# Patient Record
Sex: Female | Born: 2000
Health system: Southern US, Community
[De-identification: ages and names within clinical notes are randomized; demographics above are authoritative.]

## PROBLEM LIST (undated history)

## (undated) ENCOUNTER — Inpatient Hospital Stay: Payer: Self-pay

## (undated) DIAGNOSIS — F32A Depression, unspecified: Secondary | ICD-10-CM

## (undated) DIAGNOSIS — F329 Major depressive disorder, single episode, unspecified: Secondary | ICD-10-CM

## (undated) HISTORY — PX: TYMPANOSTOMY TUBE PLACEMENT: SHX32

## (undated) HISTORY — DX: Major depressive disorder, single episode, unspecified: F32.9

## (undated) HISTORY — PX: TONSILLECTOMY: SUR1361

## (undated) HISTORY — DX: Depression, unspecified: F32.A

---
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
## 2019-01-03 ENCOUNTER — Other Ambulatory Visit: Payer: Self-pay

## 2019-01-03 DIAGNOSIS — Z20822 Contact with and (suspected) exposure to covid-19: Secondary | ICD-10-CM

## 2019-01-05 LAB — NOVEL CORONAVIRUS, NAA: SARS-CoV-2, NAA: NOT DETECTED

## 2019-04-25 ENCOUNTER — Ambulatory Visit (INDEPENDENT_AMBULATORY_CARE_PROVIDER_SITE_OTHER): Payer: Medicaid Other | Admitting: Obstetrics and Gynecology

## 2019-04-25 ENCOUNTER — Telehealth: Payer: Self-pay | Admitting: Obstetrics and Gynecology

## 2019-04-25 ENCOUNTER — Encounter: Payer: Self-pay | Admitting: Obstetrics and Gynecology

## 2019-04-25 ENCOUNTER — Other Ambulatory Visit: Payer: Self-pay

## 2019-04-25 VITALS — BP 102/58 | Ht 64.0 in | Wt 171.0 lb

## 2019-04-25 DIAGNOSIS — Z3009 Encounter for other general counseling and advice on contraception: Secondary | ICD-10-CM | POA: Diagnosis not present

## 2019-04-25 DIAGNOSIS — N941 Unspecified dyspareunia: Secondary | ICD-10-CM | POA: Diagnosis not present

## 2019-04-25 MED ORDER — LIDOCAINE 5 % EX OINT: 1.0000 "application " | TOPICAL_OINTMENT | CUTANEOUS | 0 refills | Status: DC | PRN

## 2019-04-25 MED ORDER — ETONOGESTREL-ETHINYL ESTRADIOL 0.12-0.015 MG/24HR VA RING: VAGINAL_RING | VAGINAL | 11 refills | Status: DC

## 2019-04-25 NOTE — Progress Notes (Signed)
Patient ID: Jennifer Fuentes, female   DOB: 12/06/2000, 19 y.o.   MRN: [id number]  Reason for Consult: Follow-up (Look at place where stich was pulled, missed period since november, pain with intercourse)   Referred by Dan Humphreys, [facility]  Subjective:     HPI:  Jennifer Fuentes is a 19 y.o. female. She presents today for follow up. She had a complicated second degree perineal laceration with her delivery and urethral trauma secondary to foley insertion with frank hematuria.She delivered on 08/16/2018.  She reports that her hematuria and dysuria resolved. She did not follow up with urology as they advised after her initial consultation.    I saw her several times for a perineal laceration which was slow to heal and was treated with antibiotics.   She reports that since that time she has had pain and vaginal discomfort. She reports that intercourse is extremely painful. She has pain with insertion and along there posterior vaginal wall.  She has pain even to the touch on the outside of her perineum. Some of the area feels numb.   She has been having unprotected intercourse.  She has not been using her Nuva Ring. Her last period was in November.  UPT is negative today in office.   She would like to restart the Nuvaring.   Past Medical History:  Diagnosis Date  . Depression    Family History  Problem Relation Age of Onset  . Breast cancer Maternal Grandmother   . Cancer - Cervical Maternal Grandmother   . Ovarian cancer Mother    Past Surgical History:  Procedure Laterality Date  . TONSILLECTOMY    . TYMPANOSTOMY TUBE PLACEMENT      Short Social History:  Social History   Tobacco Use  . Smoking status: Former Smoker    Quit date: 12/05/2017    Years since quitting: 1.3  . Smokeless tobacco: Never Used  Substance Use Topics  . Alcohol use: No    Allergies  Allergen Reactions  . Sulfa Antibiotics Other (See Comments)    uncertain    Current Outpatient  Medications  Medication Sig Dispense Refill  . docusate sodium (COLACE) 100 MG capsule Take 1 capsule (100 mg total) by mouth 2 (two) times daily. (Patient not taking: Reported on 10/26/2018) 20 capsule 3  . etonogestrel-ethinyl estradiol (NUVARING) 0.12-0.015 MG/24HR vaginal ring Insert vaginally and leave in place for 3 consecutive weeks, then remove for 1 week. (Patient not taking: Reported on 10/11/2018) 1 each 11  . ferrous sulfate 325 (65 FE) MG tablet Take 1 tablet (325 mg total) by mouth 2 (two) times daily with a meal. (Patient not taking: Reported on 10/26/2018) 60 tablet 11  . ibuprofen (ADVIL) 600 MG tablet Take 1 tablet (600 mg total) by mouth every 6 (six) hours. (Patient not taking: Reported on 10/26/2018) 30 tablet 3   No current facility-administered medications for this visit.    Review of Systems  Constitutional: Negative for chills, fatigue, fever and unexpected weight change.  HENT: Negative for trouble swallowing.  Eyes: Negative for loss of vision.  Respiratory: Negative for cough, shortness of breath and wheezing.  Cardiovascular: Negative for chest pain, leg swelling, palpitations and syncope.  GI: Negative for abdominal pain, blood in stool, diarrhea, nausea and vomiting.  GU: Negative for difficulty urinating, dysuria, frequency and hematuria.  Musculoskeletal: Negative for back pain, leg pain and joint pain.  Skin: Negative for rash.  Neurological: Negative for dizziness, headaches, light-headedness, numbness and seizures.  Psychiatric: Negative for behavioral problem, confusion, depressed mood and sleep disturbance.        Objective:  Objective   Vitals:   04/25/19 1614  BP: (!) 102/58  Weight: 171 lb (77.6 kg)  Height: 5\' 4"  (1.626 m)   Body mass index is 29.35 kg/m.  Physical Exam Vitals and nursing note reviewed.  Constitutional:      Appearance: She is well-developed.  HENT:     Head: Normocephalic and atraumatic.  Cardiovascular:     Rate and  Rhythm: Normal rate and regular rhythm.  Pulmonary:     Effort: Pulmonary effort is normal.     Breath sounds: Normal breath sounds.  Abdominal:     General: Bowel sounds are normal.     Palpations: Abdomen is soft.  Genitourinary:    Comments: External: Normal appearing vulva. No lesions noted.  Perineum is well healed. No drainage. Intact. Some sensitivity at posterior introitus along hymenal ring.  Speculum examination: Normal appearing cervix. No blood in the vaginal vault. no discharge.   Split speculum exam, normal posterior wall. Well healed. No abnormalities.  Bimanual examination: Uterus midline, non-tender, normal in size, shape and contour.  No CMT. No adnexal masses. No adnexal tenderness. Pelvis not fixed. Rectal exam: small external hemorrhoid. Normal tone. Somewhat thin recto-vaginal  Tissue and perineal body.  Musculoskeletal:        General: Normal range of motion.  Skin:    General: Skin is warm and dry.  Neurological:     Mental Status: She is alert and oriented to person, place, and time.  Psychiatric:        Behavior: Behavior normal.        Thought Content: Thought content normal.        Judgment: Judgment normal.        Assessment/Plan:     19 yo  1. Dyspareunia and perineal pain secondary toobstetrical laceration. Discussed options for treatment. If she is planning more vaginal deliveries I recommended delaying repair until her next child is born given that there is potential for a recurrence of the laceration with future deliveries. Will treat with topical lidocaine for the time being. If this does not help with the pain then we can consider surgical revision of the perineum.  2. Birth control- She would like to start Fair[place]. Will initiate today and repeat pregnancy test in 12 weeks. Delay unprotected intercourse for 1 week after initiation of the Nuvaring. Rx Sent   More than 30 minutes were spent face to face with the patient in the room with more than  50% of the time spent providing counseling and discussing the plan of management.      Adrian Prows MD [facility], [facility] 04/25/2019 4:30 PM

## 2019-04-25 NOTE — Telephone Encounter (Signed)
Patient requesting refill on nuva ring. [facility]

## 2019-04-26 ENCOUNTER — Other Ambulatory Visit: Payer: Self-pay

## 2019-04-26 DIAGNOSIS — Z3009 Encounter for other general counseling and advice on contraception: Secondary | ICD-10-CM

## 2019-04-26 NOTE — Telephone Encounter (Signed)
Called pt to let her know she still have refills on her Nuvaring, advised pt to call pharmacy when she needs a new rx and if they will not refill it then I will send in a refill for her.

## 2019-05-26 IMAGING — US US OB COMP LESS 14 WK
1 series · 14 of 28 positions shown · non-contrast
Comparison: None.

CLINICAL DATA: Positive pregnancy test, pelvic pain and bleeding

EXAM:
OBSTETRIC <14 WK ULTRASOUND
TECHNIQUE: Transabdominal ultrasound was performed for evaluation of the
gestation as well as the maternal uterus and adnexal regions.

[Series 1: us ob comp less 14 wk · 0.22mm/px · 14 of 30 slices shown]
[im 2/30]
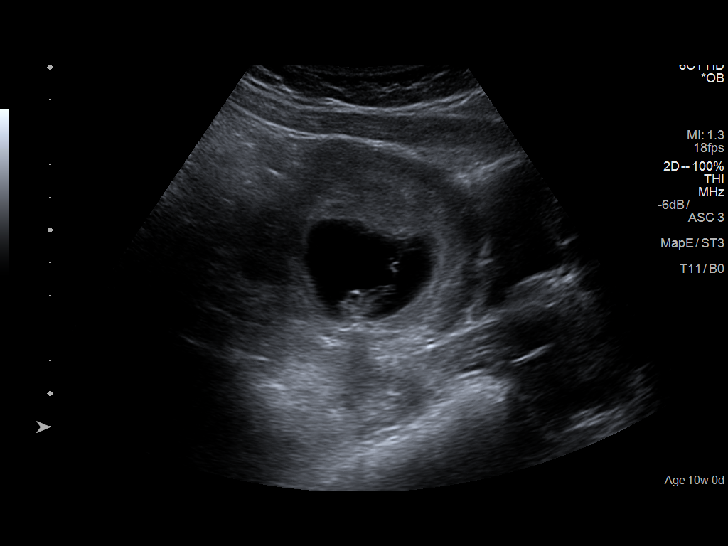
[im 4/30]
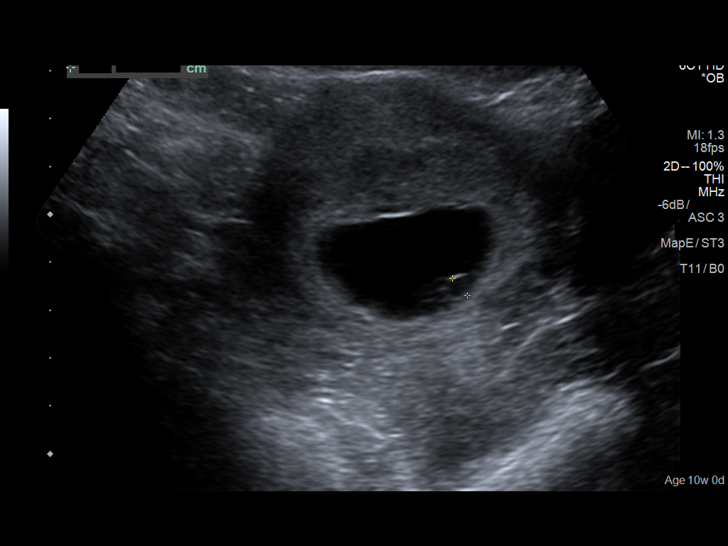
[im 6/30]
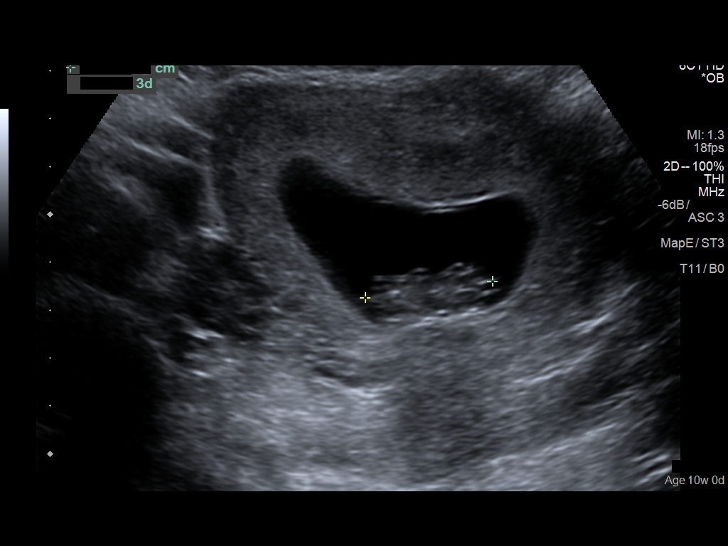
[im 8/30]
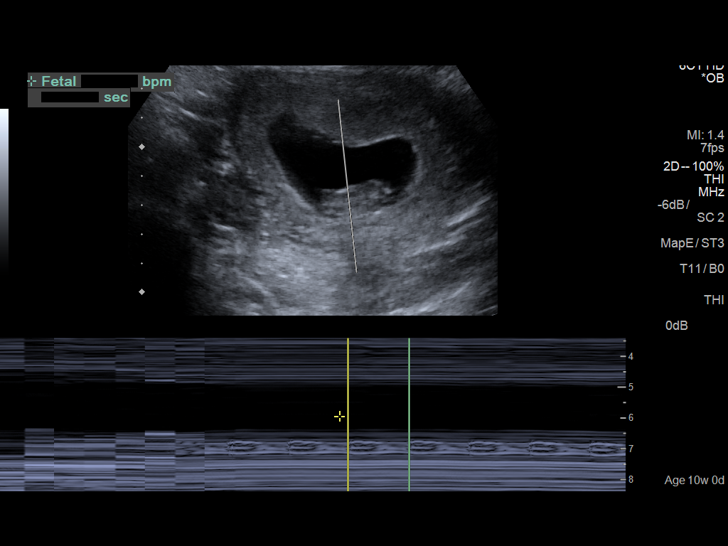
[im 10/30]
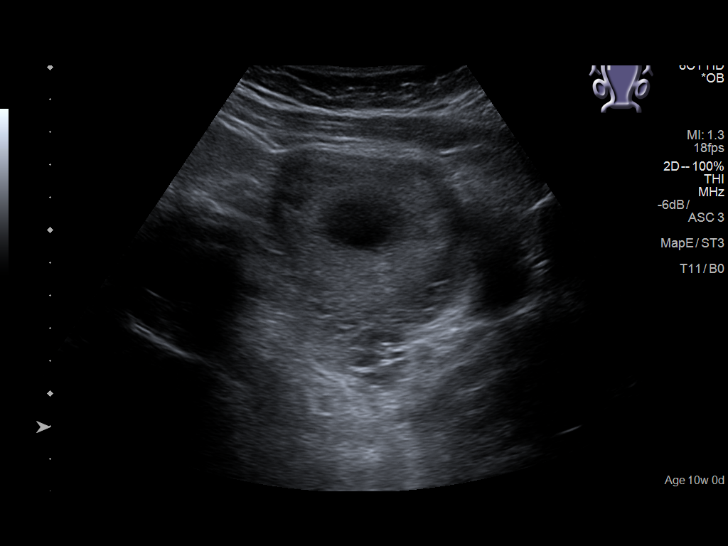
[im 12/30]
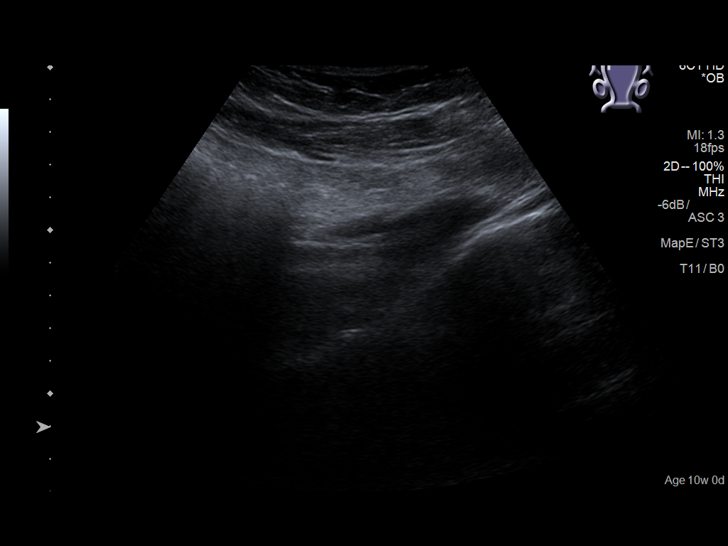
[im 14/30]
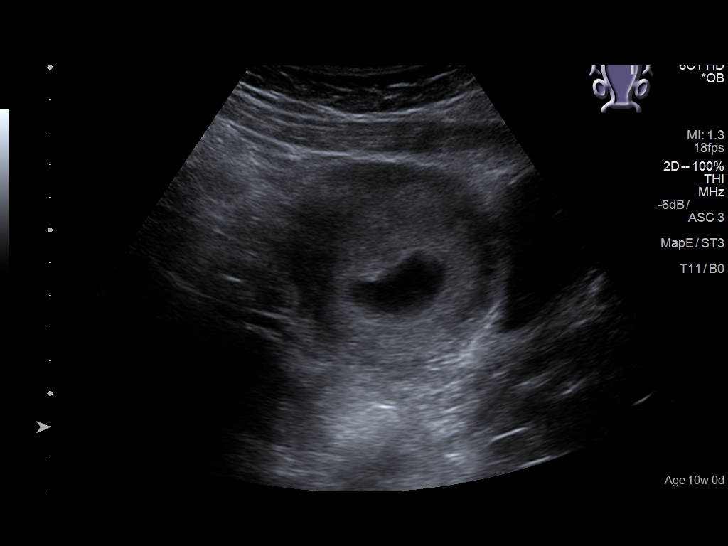
[im 17/30]
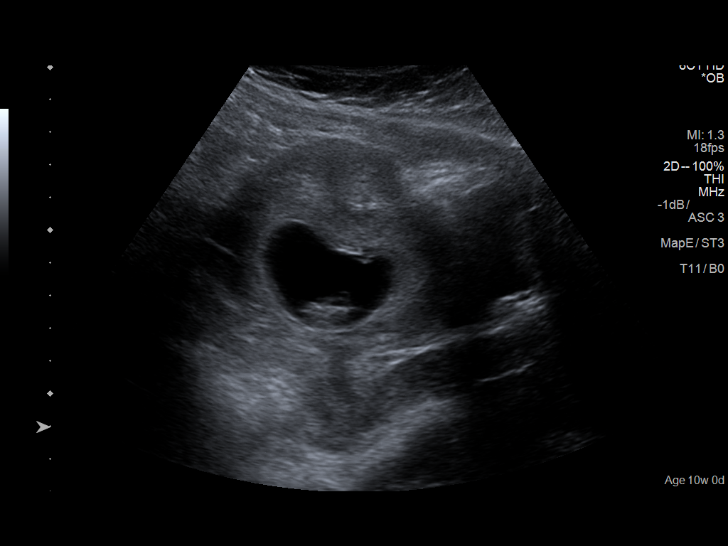
[im 19/30]
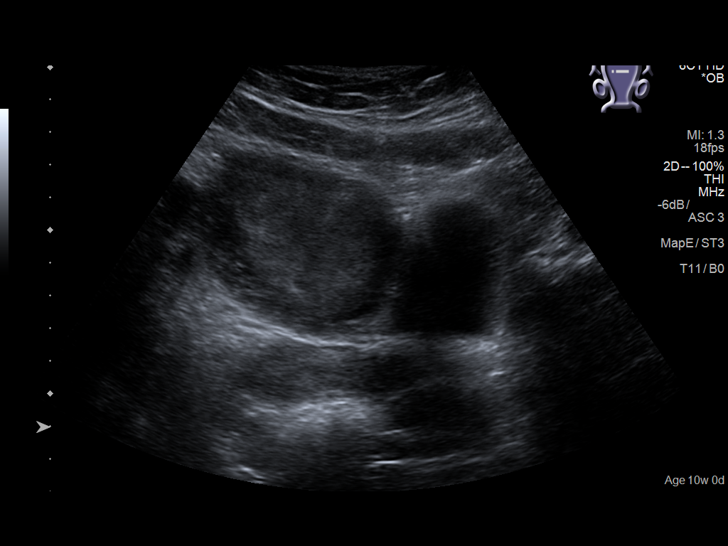
[im 21/30]
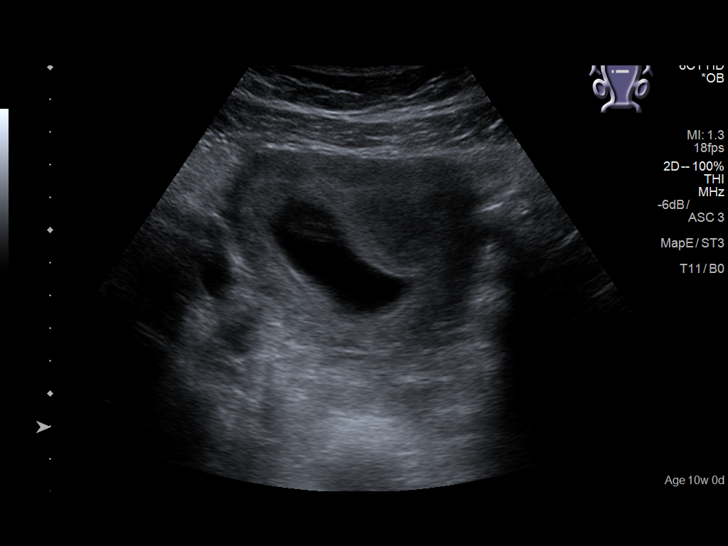
[im 23/30]
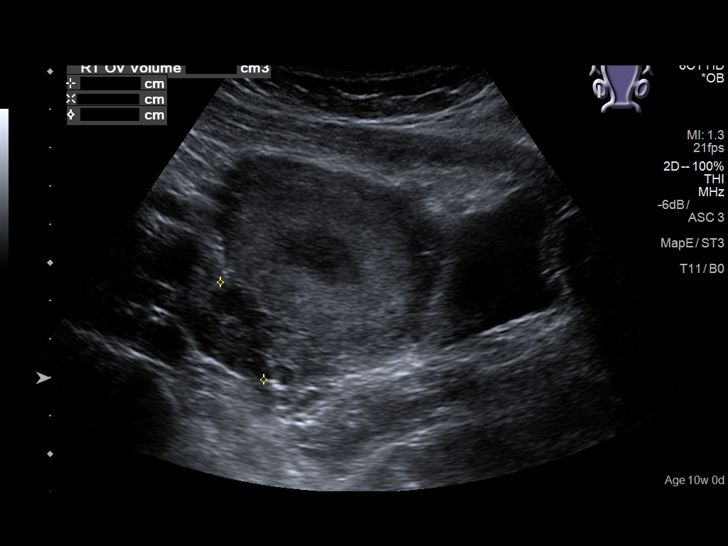
[im 25/30]
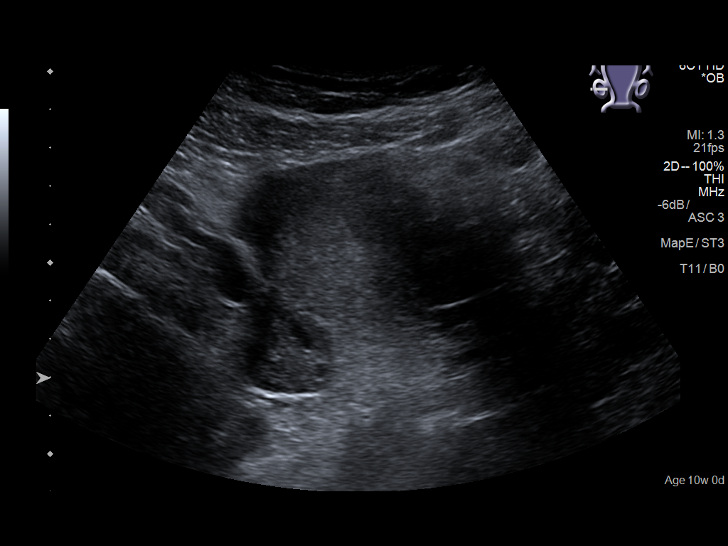
[im 27/30]
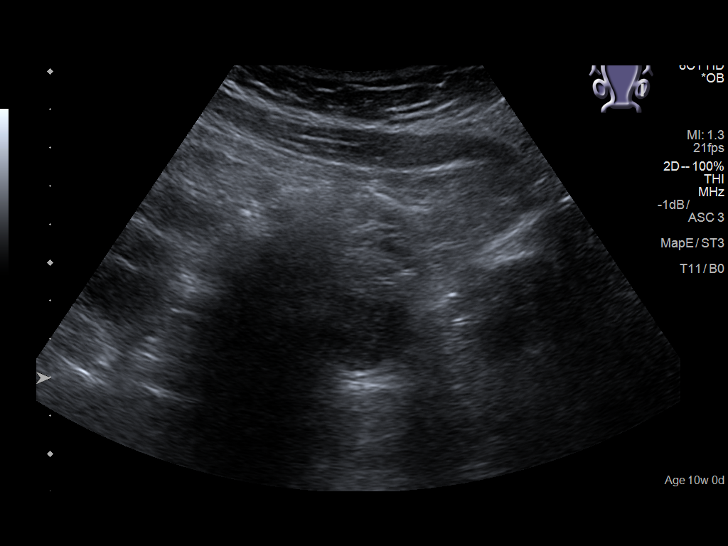
[im 30/30]
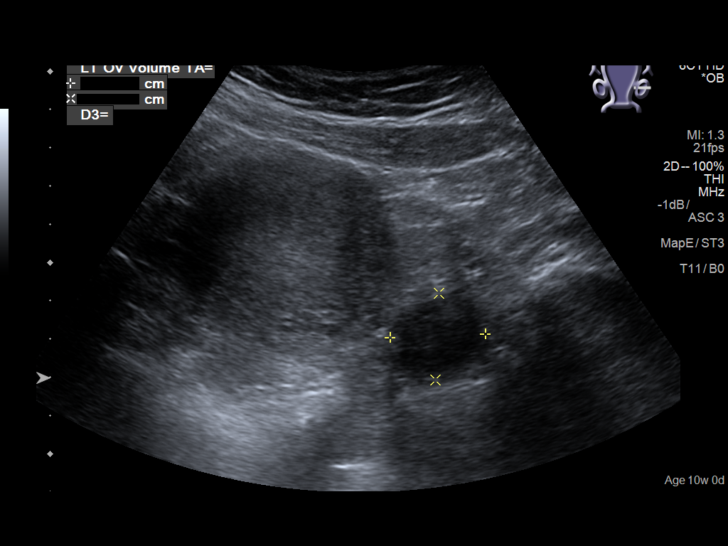

[14 of 28 positions shown; findings below may reference images not displayed]

FINDINGS: Intrauterine gestational sac: Single

Yolk sac:  Visualized.

Embryo:  Visualized.

Cardiac Activity: Visualized.

Heart Rate: 168 bpm

CRL:   26 mm   9 w 2 d                  US EDC: 08/18/2018

Subchorionic hemorrhage:  None visualized.

Maternal uterus/adnexae: Ovaries appear normal.  No free fluid.
IMPRESSION: Single viable 9 week 2 day IUP.

## 2019-06-24 ENCOUNTER — Ambulatory Visit: Payer: Medicaid Other | Admitting: Obstetrics and Gynecology

## 2019-08-25 ENCOUNTER — Ambulatory Visit: Admission: EM | Admit: 2019-08-25 | Payer: Medicaid Other

## 2020-03-14 ENCOUNTER — Ambulatory Visit
Admission: EM | Admit: 2020-03-14 | Discharge: 2020-03-14 | Disposition: A | Discharge status: Home / Self Care | Payer: Medicaid Other | Attending: Physician Assistant | Admitting: Physician Assistant

## 2020-03-14 ENCOUNTER — Other Ambulatory Visit: Payer: Self-pay

## 2020-03-14 DIAGNOSIS — Z111 Encounter for screening for respiratory tuberculosis: Secondary | ICD-10-CM

## 2020-03-14 MED ORDER — TUBERCULIN PPD 5 UNIT/0.1ML ID SOLN
5.0000 [IU] | Freq: Once | INTRADERMAL | Status: DC
  Administered 2020-03-14: 5 [IU] via INTRADERMAL

## 2020-03-14 NOTE — ED Triage Notes (Signed)
Patient in today for PPD placement. Patient denies any positives TB tests in the past. PPD placed left lower forearm. Patient tolerated well. Patient advised to return to clinic to have PPD read in 48-72 hours. Patient voiced agreement and understanding.

## 2020-03-17 ENCOUNTER — Other Ambulatory Visit: Payer: Self-pay

## 2020-03-17 ENCOUNTER — Ambulatory Visit
Admission: EM | Admit: 2020-03-17 | Discharge: 2020-03-17 | Disposition: A | Discharge status: Home / Self Care | Payer: Medicaid Other | Attending: Family Medicine | Admitting: Family Medicine

## 2020-03-17 DIAGNOSIS — Z111 Encounter for screening for respiratory tuberculosis: Secondary | ICD-10-CM | POA: Diagnosis not present

## 2020-03-17 NOTE — ED Triage Notes (Signed)
Patient here for PPD reading.  Patient's results were 19mm Negative in her left forearm on 03/17/20 at 0912.

## 2020-08-17 ENCOUNTER — Ambulatory Visit: Payer: Self-pay | Admitting: *Deleted

## 2020-08-17 NOTE — Telephone Encounter (Signed)
Pt called with complaints of severe lower abdominal pain that worsens when she sits; these pains started the  08/17/20 at 0700; she experienced "lightening crotch" like this when she was pregnant; the pain is rated 10 out of 10, intermittent, and stabbing; recommendations made per nurse triage protocol; she verbalized understanding and will go to the emergency room.   Reason for Disposition . [1] SEVERE pain (e.g., excruciating) AND [2] present > 1 hour  Answer Assessment - Initial Assessment Questions 1. LOCATION: "Where does it hurt?"      Lower,middle 2. RADIATION: "Does the pain shoot anywhere else?" (e.g., chest, back)    no 3. ONSET: "When did the pain begin?" (e.g., minutes, hours or days ago)     0700 08/17/20 4. SUDDEN: "Gradual or sudden onset?"     suddenly 5. PATTERN "Does the pain come and go, or is it constant?"    - If constant: "Is it getting better, staying the same, or worsening?"      (Note: Constant means the pain never goes away completely; most serious pain is constant and it progresses)     - If intermittent: "How long does it last?" "Do you have pain now?"     (Note: Intermittent means the pain goes away completely between bouts)    intermittent 6. SEVERITY: "How bad is the pain?"  (e.g., Scale 1-10; mild, moderate, or severe)   - MILD (1-3): doesn't interfere with normal activities, abdomen soft and not tender to touch    - MODERATE (4-7): interferes with normal activities or awakens from sleep, abdomen tender to touch    - SEVERE (8-10): excruciating pain, doubled over, unable to do any normal activities      10 out of 10 7. RECURRENT SYMPTOM: "Have you ever had this type of stomach pain before?" If Yes, ask: "When was the last time?" and "What happened that time?"      Yes when pregnant 8. CAUSE: "What do you think is causing the stomach pain?"     Not sure 9. RELIEVING/AGGRAVATING FACTORS: "What makes it better or worse?" (e.g., movement, antacids, bowel  movement)     no 10. OTHER SYMPTOMS: "Do you have any other symptoms?" (e.g., back pain, diarrhea, fever, urination pain, vomiting)       no 11. PREGNANCY: "Is there any chance you are pregnant?" "When was your last menstrual period?"       No, LMP 07/30/20  Protocols used: ABDOMINAL PAIN - [facility]

## 2020-08-31 ENCOUNTER — Telehealth: Payer: Self-pay

## 2020-08-31 NOTE — Telephone Encounter (Signed)
Called pt and asked that she come in for chlamydia check. She has a GYN doctor, therefore I advised her to get it done through them if she didn't want to come here. She will let me know what she is going to do next week

## 2020-12-12 ENCOUNTER — Encounter: Payer: Self-pay | Admitting: Hematology and Oncology

## 2020-12-13 ENCOUNTER — Ambulatory Visit
Admission: EM | Admit: 2020-12-13 | Discharge: 2020-12-13 | Disposition: A | Discharge status: Home / Self Care | Payer: Medicaid Other | Attending: Family Medicine | Admitting: Family Medicine

## 2020-12-13 ENCOUNTER — Other Ambulatory Visit: Payer: Self-pay

## 2020-12-13 DIAGNOSIS — R040 Epistaxis: Secondary | ICD-10-CM | POA: Diagnosis not present

## 2020-12-13 DIAGNOSIS — H6592 Unspecified nonsuppurative otitis media, left ear: Secondary | ICD-10-CM | POA: Diagnosis not present

## 2020-12-13 DIAGNOSIS — R059 Cough, unspecified: Secondary | ICD-10-CM | POA: Diagnosis not present

## 2020-12-13 MED ORDER — AMOXICILLIN-POT CLAVULANATE 875-125 MG PO TABS: 1.0000 | ORAL_TABLET | Freq: Two times a day (BID) | ORAL | 0 refills | Status: DC

## 2020-12-13 MED ORDER — PROMETHAZINE-DM 6.25-15 MG/5ML PO SYRP: 5.0000 mL | ORAL_SOLUTION | Freq: Four times a day (QID) | ORAL | 0 refills | Status: DC | PRN

## 2020-12-13 NOTE — ED Provider Notes (Signed)
[facility]    CSN: [id number] Arrival date & time: 12/13/20  1839      History   Chief Complaint Chief Complaint  Patient presents with   Ear Pain   Cough   HPI  20 year old female presents with the above complaints..  Patient has had symptoms since Saturday.  She reports frequent nosebleeds, and cough.  Cough is worse at night.  Also reports left ear pain.  Pain is currently 7/10 in severity.  No documented fever.  Her daughter has recently been sick with similar symptoms.  She states that her nosebleed earlier today took several minutes to resolve.  No other associated symptoms.  No other complaints.  Past Medical History:  Diagnosis Date   Depression    Patient Active Problem List   Diagnosis Date Noted   [redacted] weeks gestation of pregnancy 08/14/2018   Iron deficiency anemia 07/20/2018   Indication for care in labor and delivery, antepartum 07/10/2018   Low back pain 06/22/2018   Tobacco use in pregnancy, antepartum, first trimester 12/18/2017   RLQ abdominal pain 06/04/2017   History of ovarian cyst 06/04/2017   Urinary tract infection without hematuria 06/04/2017   Family history of ovarian cancer 04/08/2017    Past Surgical History:  Procedure Laterality Date   TONSILLECTOMY     TYMPANOSTOMY TUBE PLACEMENT      OB History     Gravida  1   Para  1   Term  1   Preterm  0   AB  0   Living  1      SAB  0   IAB  0   Ectopic  0   Multiple  0   Live Births  1            Home Medications    Prior to Admission medications   Medication Sig Start Date End Date Taking? Authorizing Provider  amoxicillin-clavulanate (AUGMENTIN) 875-125 MG tablet Take 1 tablet by mouth 2 (two) times daily. 12/13/20  Yes Suesan Mohrmann G, DO  promethazine-dextromethorphan (PROMETHAZINE-DM) 6.25-15 MG/5ML syrup Take 5 mLs by mouth 4 (four) times daily as needed for cough. 12/13/20  Yes Tommie Sams, DO    Family History Family History  Problem Relation  Age of Onset   Breast cancer Maternal Grandmother    Cancer - Cervical Maternal Grandmother    Ovarian cancer Mother     Social History Social History   Tobacco Use   Smoking status: Former    Types: Cigarettes    Quit date: 12/05/2017    Years since quitting: 3.0   Smokeless tobacco: Never  Vaping Use   Vaping Use: Former  Substance Use Topics   Alcohol use: No   Drug use: Not Currently    Types: Marijuana     Allergies   Sulfa antibiotics   Review of Systems Review of Systems Per HPI  Physical Exam Triage Vital Signs ED Triage Vitals  Enc Vitals Group     BP 12/13/20 1919 118/74     Pulse Rate 12/13/20 1919 85     Resp 12/13/20 1919 17     Temp 12/13/20 1919 99 F (37.2 C)     Temp Source 12/13/20 1919 Oral     SpO2 12/13/20 1919 98 %     Weight 12/13/20 1917 150 lb (68 kg)     Height 12/13/20 1917 5\' 4"  (1.626 m)     Head Circumference --      Peak  Flow --      Pain Score 12/13/20 1917 7     Pain Loc --      Pain Edu? --      Excl. in GC? --    No data found.  Updated Vital Signs BP 118/74 (BP Location: Left Arm)   Pulse 85   Temp 99 F (37.2 C) (Oral)   Resp 17   Ht 5\' 4"  (1.626 m)   Wt 68 kg   LMP 12/11/2020   SpO2 98%   BMI 25.75 kg/m   Visual Acuity Right Eye Distance:   Left Eye Distance:   Bilateral Distance:    Right Eye Near:   Left Eye Near:    Bilateral Near:     Physical Exam Vitals and nursing note reviewed.  Constitutional:      General: She is not in acute distress.    Appearance: Normal appearance. She is not ill-appearing.  HENT:     Head: Normocephalic and atraumatic.     Left Ear: Tympanic membrane normal.     Ears:     Comments: Left ear effusion.    Nose: Congestion present.     Mouth/Throat:     Comments: Cobblestoning noted. Cardiovascular:     Rate and Rhythm: Normal rate and regular rhythm.  Pulmonary:     Effort: Pulmonary effort is normal.     Breath sounds: Normal breath sounds. No wheezing,  rhonchi or rales.  Neurological:     Mental Status: She is alert.     UC Treatments / Results  Labs (all labs ordered are listed, but only abnormal results are displayed) Labs Reviewed - No data to display  EKG   Radiology No results found.  Procedures Procedures (including critical care time)  Medications Ordered in UC Medications - No data to display  Initial Impression / Assessment and Plan / UC Course  I have reviewed the triage vital signs and the nursing notes.  Pertinent labs & imaging results that were available during my care of the patient were reviewed by me and considered in my medical decision making (see chart for details).    20 year old female presents with the above complaints.  Effusion noted on exam.  Treating with Augmentin.  Promethazine DM for cough.  Regarding her frequent nosebleeds, advised nasal saline.  Supportive care.  Final Clinical Impressions(s) / UC Diagnoses   Final diagnoses:  Fluid level behind tympanic membrane of left ear  Cough  Frequent nosebleeds     Discharge Instructions      Medication as directed.  Nasal saline twice daily.   Take care  Dr. 36   ED Prescriptions     Medication Sig Dispense Auth. Provider   promethazine-dextromethorphan (PROMETHAZINE-DM) 6.25-15 MG/5ML syrup Take 5 mLs by mouth 4 (four) times daily as needed for cough. 118 mL Alishea Beaudin G, DO   amoxicillin-clavulanate (AUGMENTIN) 875-125 MG tablet Take 1 tablet by mouth 2 (two) times daily. 20 tablet 06-[birth year]-1983, DO      PDMP not reviewed this encounter.   Tommie Sams, Tommie Sams 12/13/20 2036

## 2020-12-13 NOTE — Discharge Instructions (Addendum)
Medication as directed.  Nasal saline twice daily.   Take care  Dr. Adriana Simas

## 2020-12-13 NOTE — ED Triage Notes (Signed)
Patient states that she has been having left ear pain, cough and 2 nosebleeds. States that her daughter was sick earlier in the week and she had similar symptoms.

## 2021-03-29 ENCOUNTER — Encounter: Payer: Self-pay | Admitting: Hematology and Oncology

## 2021-04-23 ENCOUNTER — Ambulatory Visit
Admission: EM | Admit: 2021-04-23 | Discharge: 2021-04-23 | Disposition: A | Discharge status: Home / Self Care | Payer: Medicaid Other | Attending: Emergency Medicine | Admitting: Emergency Medicine

## 2021-04-23 ENCOUNTER — Encounter: Payer: Self-pay | Admitting: Emergency Medicine

## 2021-04-23 ENCOUNTER — Other Ambulatory Visit: Payer: Self-pay

## 2021-04-23 DIAGNOSIS — J0100 Acute maxillary sinusitis, unspecified: Secondary | ICD-10-CM

## 2021-04-23 MED ORDER — AMOXICILLIN 875 MG PO TABS
875.0000 mg | ORAL_TABLET | Freq: Two times a day (BID) | ORAL | 0 refills | Status: AC
Start: 2021-04-23 — End: 2021-05-03

## 2021-04-23 NOTE — Discharge Instructions (Addendum)
Take the amoxicillin as directed.  Take plain Mucinex and ibuprofen also.  Follow up with your primary care provider if your symptoms are not improving.

## 2021-04-23 NOTE — ED Provider Notes (Signed)
[facility]    CSN: [id number] Arrival date & time: 04/23/21  1755      History   Chief Complaint Chief Complaint  Patient presents with   Cough   Otalgia   Nasal Congestion   Headache    HPI Jennifer Fuentes is a 21 y.o. female.  Patient presents with 2 week history of sinus congestion, runny nose, ear pain, cough. She has sore throat and headache when coughing.  No fever, rash, shortness of breath, vomiting, diarrhea, or other symptoms.  Treatment at home with OTC sinus medication.    The history is provided by the patient and medical records.   Past Medical History:  Diagnosis Date   Depression     Patient Active Problem List   Diagnosis Date Noted   [redacted] weeks gestation of pregnancy 08/14/2018   Iron deficiency anemia 07/20/2018   Indication for care in labor and delivery, antepartum 07/10/2018   Low back pain 06/22/2018   Tobacco use in pregnancy, antepartum, first trimester 12/18/2017   RLQ abdominal pain 06/04/2017   History of ovarian cyst 06/04/2017   Urinary tract infection without hematuria 06/04/2017   Family history of ovarian cancer 04/08/2017    Past Surgical History:  Procedure Laterality Date   TONSILLECTOMY     TYMPANOSTOMY TUBE PLACEMENT      OB History     Gravida  1   Para  1   Term  1   Preterm  0   AB  0   Living  1      SAB  0   IAB  0   Ectopic  0   Multiple  0   Live Births  1            Home Medications    Prior to Admission medications   Medication Sig Start Date End Date Taking? Authorizing Provider  amoxicillin (AMOXIL) 875 MG tablet Take 1 tablet (875 mg total) by mouth 2 (two) times daily for 10 days. 04/23/21 05/03/21 Yes Sharion Balloon, NP  promethazine-dextromethorphan (PROMETHAZINE-DM) 6.25-15 MG/5ML syrup Take 5 mLs by mouth 4 (four) times daily as needed for cough. 12/13/20   Coral Spikes, DO    Family History Family History  Problem Relation Age of Onset   Breast cancer Maternal  Grandmother    Cancer - Cervical Maternal Grandmother    Ovarian cancer Mother     Social History Social History   Tobacco Use   Smoking status: Former    Types: Cigarettes    Quit date: 12/05/2017    Years since quitting: 3.3   Smokeless tobacco: Never  Vaping Use   Vaping Use: Former  Substance Use Topics   Alcohol use: No   Drug use: Not Currently    Types: Marijuana     Allergies   Sulfa antibiotics   Review of Systems Review of Systems  Constitutional:  Negative for chills and fever.  HENT:  Positive for congestion, ear pain, postnasal drip, rhinorrhea, sinus pressure and sore throat.   Respiratory:  Positive for cough. Negative for shortness of breath.   Gastrointestinal:  Negative for diarrhea and vomiting.  Skin:  Negative for color change and rash.  Neurological:  Positive for headaches.  All other systems reviewed and are negative.   Physical Exam Triage Vital Signs ED Triage Vitals  Enc Vitals Group     BP      Pulse      Resp  Temp      Temp src      SpO2      Weight      Height      Head Circumference      Peak Flow      Pain Score      Pain Loc      Pain Edu?      Excl. in GC?    No data found.  Updated Vital Signs BP 121/77 (BP Location: Left Arm)    Pulse 88    Temp 98.2 F (36.8 C)    Resp 18    SpO2 97%   Visual Acuity Right Eye Distance:   Left Eye Distance:   Bilateral Distance:    Right Eye Near:   Left Eye Near:    Bilateral Near:     Physical Exam Vitals and nursing note reviewed.  Constitutional:      General: She is not in acute distress.    Appearance: Normal appearance. She is well-developed.  HENT:     Right Ear: Tympanic membrane normal.     Left Ear: Tympanic membrane normal.     Nose: Congestion and rhinorrhea present.     Mouth/Throat:     Mouth: Mucous membranes are moist.     Pharynx: Oropharynx is clear.  Cardiovascular:     Rate and Rhythm: Normal rate and regular rhythm.     Heart sounds:  Normal heart sounds.  Pulmonary:     Effort: Pulmonary effort is normal. No respiratory distress.     Breath sounds: Normal breath sounds.  Abdominal:     Palpations: Abdomen is soft.  Musculoskeletal:     Cervical back: Neck supple.  Skin:    General: Skin is warm and dry.  Neurological:     Mental Status: She is alert.  Psychiatric:        Mood and Affect: Mood normal.        Behavior: Behavior normal.     UC Treatments / Results  Labs (all labs ordered are listed, but only abnormal results are displayed) Labs Reviewed - No data to display  EKG   Radiology No results found.  Procedures Procedures (including critical care time)  Medications Ordered in UC Medications - No data to display  Initial Impression / Assessment and Plan / UC Course  I have reviewed the triage vital signs and the nursing notes.  Pertinent labs & imaging results that were available during my care of the patient were reviewed by me and considered in my medical decision making (see chart for details).    Acute sinusitis.  Patient has been symptomatic for 2 weeks and is not improving with OTC treatment.  Treating today with amoxicillin.  Discussed symptomatic treatment with plain Mucinex and ibuprofen.  Education provided on sinusitis.  Instructed patient to follow up with her PCP if her symptoms are not improving.  She agrees to plan of care.   Final Clinical Impressions(s) / UC Diagnoses   Final diagnoses:  Acute non-recurrent maxillary sinusitis     Discharge Instructions      Take the amoxicillin as directed.  Take plain Mucinex and ibuprofen also.  Follow up with your primary care provider if your symptoms are not improving.        ED Prescriptions     Medication Sig Dispense Auth. Provider   amoxicillin (AMOXIL) 875 MG tablet Take 1 tablet (875 mg total) by mouth 2 (two) times daily for 10 days. 20  tablet Sharion Balloon, NP      PDMP not reviewed this encounter.   Sharion Balloon, NP 04/23/21 1845

## 2021-04-23 NOTE — ED Triage Notes (Signed)
Pt here with congestion, sore throat, cough, headache, ear pain x 2 weeks. No fevers.

## 2022-02-17 ENCOUNTER — Encounter: Payer: Self-pay | Admitting: Hematology and Oncology

## 2022-02-21 ENCOUNTER — Ambulatory Visit
Admission: RE | Admit: 2022-02-21 | Discharge: 2022-02-21 | Disposition: A | Discharge status: Home / Self Care | Payer: Medicaid Other | Source: Ambulatory Visit | Attending: Emergency Medicine | Admitting: Emergency Medicine

## 2022-02-21 VITALS — BP 109/74 | HR 83 | Resp 18 | Ht 64.0 in | Wt 155.0 lb

## 2022-02-21 DIAGNOSIS — B3731 Acute candidiasis of vulva and vagina: Secondary | ICD-10-CM | POA: Diagnosis not present

## 2022-02-21 DIAGNOSIS — N760 Acute vaginitis: Secondary | ICD-10-CM

## 2022-02-21 DIAGNOSIS — B9689 Other specified bacterial agents as the cause of diseases classified elsewhere: Secondary | ICD-10-CM | POA: Diagnosis not present

## 2022-02-21 LAB — URINALYSIS, ROUTINE W REFLEX MICROSCOPIC
Bilirubin Urine: NEGATIVE
Glucose, UA: NEGATIVE mg/dL
Hgb urine dipstick: NEGATIVE
Ketones, ur: NEGATIVE mg/dL
Nitrite: NEGATIVE
Specific Gravity, Urine: 1.015 (ref 1.005–1.030)
pH: 8.5 — ABNORMAL HIGH (ref 5.0–8.0)

## 2022-02-21 LAB — URINALYSIS, MICROSCOPIC (REFLEX)

## 2022-02-21 LAB — WET PREP, GENITAL
Sperm: NONE SEEN
Trich, Wet Prep: NONE SEEN
WBC, Wet Prep HPF POC: >10 — AB (ref <10)

## 2022-02-21 LAB — PREGNANCY, URINE: Preg Test, Ur: NEGATIVE

## 2022-02-21 MED ORDER — FLUCONAZOLE 150 MG PO TABS: 150.0000 mg | ORAL_TABLET | Freq: Every day | ORAL | 0 refills | Status: AC

## 2022-02-21 MED ORDER — METRONIDAZOLE 500 MG PO TABS: 500.0000 mg | ORAL_TABLET | Freq: Two times a day (BID) | ORAL | 0 refills | Status: DC

## 2022-02-21 NOTE — ED Triage Notes (Signed)
Pt c/o nausea x5days, abdominal pain in the morning, swelling along the abdomen, pain along the upper groin and left flank, x1day  Pt states that she is also having vaginal itching over the weekend but it has gone away.

## 2022-02-21 NOTE — Discharge Instructions (Addendum)
Today you are being treated  for  Bacterial vaginosis and yeast  Wet Prep is negative for trichomoniasis  Urinalysis is negative  Urine pregnancy is negative  Take Metronidazole 500 mg twice a day for 7 days, do not drink alcohol while using medication, this will make you feel sick   Take 1 Diflucan tablet on the day that you receive your medicine then after completion of metronidazole take second tablet  Bacterial vaginosis and yeast occur due to changes in the vaginal ph which results from an overgrowth of one on several organisms that are normally present in your vagina. Vaginosis is an inflammation of the vagina that can result in discharge, itching and pain.    In addition: Avoid baths, hot tubs and whirlpool spas.  Don't use scented or harsh soaps Avoid irritants. These include scented tampons and pads. Wipe from front to back after using the toilet. Don't douche. Your vagina doesn't require cleansing other than normal bathing.  Use a condom.  Wear cotton underwear, this fabric absorbs some moisture.

## 2022-02-21 NOTE — ED Provider Notes (Signed)
[facility]    CSN: [id number] Arrival date & time: 02/21/22  1813      History   Chief Complaint Chief Complaint  Patient presents with   Abdominal Pain    HPI Jennifer Fuentes is a 21 y.o. female.   Patient presents with nausea without vomiting for 5 days, began to have constant lower abdominal pain and left lower back pain beginning overnight.  Pain is described as an aching sharp pain after eating and throbbing pain after movement.  He experienced vaginal itching over the weekend but this has resolved.  Has been experiencing dysuria intermittently as well.  Sexually active, 1 female partner, no known exposure.  Last menstrual period 01/26/2020.  Has attempted use of over-the-counter Azo without relief. Denies Urinary frequency, urgency, hematuria, vaginal discharge, vaginal odor.     Past Medical History:  Diagnosis Date   Depression     Patient Active Problem List   Diagnosis Date Noted   [redacted] weeks gestation of pregnancy 08/14/2018   Iron deficiency anemia 07/20/2018   Indication for care in labor and delivery, antepartum 07/10/2018   Low back pain 06/22/2018   Tobacco use in pregnancy, antepartum, first trimester 12/18/2017   RLQ abdominal pain 06/04/2017   History of ovarian cyst 06/04/2017   Urinary tract infection without hematuria 06/04/2017   Family history of ovarian cancer 04/08/2017    Past Surgical History:  Procedure Laterality Date   TONSILLECTOMY     TYMPANOSTOMY TUBE PLACEMENT      OB History     Gravida  1   Para  1   Term  1   Preterm  0   AB  0   Living  1      SAB  0   IAB  0   Ectopic  0   Multiple  0   Live Births  1            Home Medications    Prior to Admission medications   Medication Sig Start Date End Date Taking? Authorizing Provider  promethazine-dextromethorphan (PROMETHAZINE-DM) 6.25-15 MG/5ML syrup Take 5 mLs by mouth 4 (four) times daily as needed for cough. 12/13/20   Tommie Sams,  DO    Family History Family History  Problem Relation Age of Onset   Breast cancer Maternal Grandmother    Cancer - Cervical Maternal Grandmother    Ovarian cancer Mother     Social History Social History   Tobacco Use   Smoking status: Former    Types: Cigarettes    Quit date: 12/05/2017    Years since quitting: 4.2   Smokeless tobacco: Never  Vaping Use   Vaping Use: Former  Substance Use Topics   Alcohol use: No   Drug use: Not Currently    Types: Marijuana     Allergies   Sulfa antibiotics   Review of Systems Review of Systems  Gastrointestinal:  Positive for abdominal pain and diarrhea. Negative for abdominal distention, anal bleeding, blood in stool, constipation, nausea, rectal pain and vomiting.  Genitourinary:  Positive for dysuria. Negative for decreased urine volume, difficulty urinating, dyspareunia, enuresis, flank pain, frequency, genital sores, hematuria, menstrual problem, pelvic pain, urgency, vaginal bleeding, vaginal discharge and vaginal pain.  Skin: Negative.      Physical Exam Triage Vital Signs ED Triage Vitals  Enc Vitals Group     BP 02/21/22 1829 109/74     Pulse Rate 02/21/22 1829 83     Resp 02/21/22  1829 18     Temp --      Temp Source 02/21/22 1829 Oral     SpO2 02/21/22 1829 98 %     Weight 02/21/22 1826 155 lb (70.3 kg)     Height 02/21/22 1826 5\' 4"  (1.626 m)     Head Circumference --      Peak Flow --      Pain Score 02/21/22 1826 4     Pain Loc --      Pain Edu? --      Excl. in GC? --    No data found.  Updated Vital Signs BP 109/74 (BP Location: Left Arm)   Pulse 83   Resp 18   Ht 5\' 4"  (1.626 m)   Wt 155 lb (70.3 kg)   LMP 01/13/2022   SpO2 98%   BMI 26.61 kg/m   Visual Acuity Right Eye Distance:   Left Eye Distance:   Bilateral Distance:    Right Eye Near:   Left Eye Near:    Bilateral Near:     Physical Exam Constitutional:      Appearance: Normal appearance. She is well-developed.  Eyes:      Extraocular Movements: Extraocular movements intact.  Pulmonary:     Effort: Pulmonary effort is normal.  Abdominal:     Tenderness: There is abdominal tenderness in the suprapubic area, left upper quadrant and left lower quadrant. There is no right CVA tenderness or left CVA tenderness.  Genitourinary:    Comments: deferred Skin:    General: Skin is warm and dry.  Neurological:     Mental Status: She is alert and oriented to person, place, and time. Mental status is at baseline.  Psychiatric:        Behavior: Behavior normal.      UC Treatments / Results  Labs (all labs ordered are listed, but only abnormal results are displayed) Labs Reviewed  WET PREP, GENITAL  URINALYSIS, ROUTINE W REFLEX MICROSCOPIC  PREGNANCY, URINE    EKG   Radiology No results found.  Procedures Procedures (including critical care time)  Medications Ordered in UC Medications - No data to display  Initial Impression / Assessment and Plan / UC Course  I have reviewed the triage vital signs and the nursing notes.  Pertinent labs & imaging results that were available during my care of the patient were reviewed by me and considered in my medical decision making (see chart for details).  Bacterial vaginosis, yeast vaginitis  Tenderness is noted over the left upper and lower quadrants of the abdomen, low suspicion for an acute abdomen at this time, wet prep positive for yeast and BV but negative for treatment, urinalysis and urine pregnancy negative, discussed with patient, metronidazole and Diflucan prescribed, discussed administration, advised abstinence until symptoms have cleared and treatment is completed, may follow-up with urgent care as needed if symptoms persist or worsen Final Clinical Impressions(s) / UC Diagnoses   Final diagnoses:  None   Discharge Instructions   None    ED Prescriptions   None    PDMP not reviewed this encounter.   , NP 02/21/22 1859

## 2022-03-19 ENCOUNTER — Ambulatory Visit
Admission: EM | Admit: 2022-03-19 | Discharge: 2022-03-19 | Disposition: A | Discharge status: Home / Self Care | Payer: Medicaid Other | Attending: Emergency Medicine | Admitting: Emergency Medicine

## 2022-03-19 DIAGNOSIS — J069 Acute upper respiratory infection, unspecified: Secondary | ICD-10-CM | POA: Insufficient documentation

## 2022-03-19 LAB — RAPID INFLUENZA A&B ANTIGENS
Influenza A (ARMC): NEGATIVE
Influenza B (ARMC): NEGATIVE

## 2022-03-19 LAB — GROUP A STREP BY PCR: Group A Strep by PCR: NOT DETECTED

## 2022-03-19 NOTE — ED Provider Notes (Signed)
[facility]    CSN: [id number] Arrival date & time: 03/19/22  1803      History   Chief Complaint Chief Complaint  Patient presents with   URI    HPI Jennifer Fuentes is a 21 y.o. female.   Patient presents for fever, chills, body aches, nasal congestion, sore throat and nonproductive cough beginning 1 day ago.  Known sick contact.  Decreased appetite but tolerating fluids.  Has attempted use of DayQuil which has been minimally helpful.  No pertinent medical history.  Denies shortness of breath or wheezing.  For 1 day, known sick contact, dayquil, decreased appeitie,   Past Medical History:  Diagnosis Date   Depression     Patient Active Problem List   Diagnosis Date Noted   [redacted] weeks gestation of pregnancy 08/14/2018   Iron deficiency anemia 07/20/2018   Indication for care in labor and delivery, antepartum 07/10/2018   Low back pain 06/22/2018   Tobacco use in pregnancy, antepartum, first trimester 12/18/2017   RLQ abdominal pain 06/04/2017   History of ovarian cyst 06/04/2017   Urinary tract infection without hematuria 06/04/2017   Family history of ovarian cancer 04/08/2017    Past Surgical History:  Procedure Laterality Date   TONSILLECTOMY     TYMPANOSTOMY TUBE PLACEMENT      OB History     Gravida  1   Para  1   Term  1   Preterm  0   AB  0   Living  1      SAB  0   IAB  0   Ectopic  0   Multiple  0   Live Births  1            Home Medications    Prior to Admission medications   Medication Sig Start Date End Date Taking? Authorizing Provider  metroNIDAZOLE (FLAGYL) 500 MG tablet Take 1 tablet (500 mg total) by mouth 2 (two) times daily. 02/21/22   Peytin Dechert, Elita Boone, NP  promethazine-dextromethorphan (PROMETHAZINE-DM) 6.25-15 MG/5ML syrup Take 5 mLs by mouth 4 (four) times daily as needed for cough. 12/13/20   Tommie Sams, DO    Family History Family History  Problem Relation Age of Onset   Breast cancer  Maternal Grandmother    Cancer - Cervical Maternal Grandmother    Ovarian cancer Mother     Social History Social History   Tobacco Use   Smoking status: Former    Types: Cigarettes    Quit date: 12/05/2017    Years since quitting: 4.2   Smokeless tobacco: Never  Vaping Use   Vaping Use: Former  Substance Use Topics   Alcohol use: No   Drug use: Not Currently    Types: Marijuana     Allergies   Sulfa antibiotics   Review of Systems Review of Systems  Constitutional:  Positive for chills and fever. Negative for activity change, appetite change, diaphoresis, fatigue and unexpected weight change.  HENT:  Positive for congestion and sore throat. Negative for dental problem, drooling, ear discharge, ear pain, facial swelling, hearing loss, mouth sores, nosebleeds, postnasal drip, rhinorrhea, sinus pressure, sinus pain, sneezing, tinnitus, trouble swallowing and voice change.   Respiratory:  Positive for cough. Negative for apnea, choking, chest tightness, shortness of breath, wheezing and stridor.   Cardiovascular: Negative.   Gastrointestinal: Negative.   Skin: Negative.      Physical Exam Triage Vital Signs ED Triage Vitals  Enc Vitals Group  BP 03/19/22 1927 118/71     Pulse Rate 03/19/22 1927 (!) 104     Resp 03/19/22 1927 16     Temp 03/19/22 1927 99.2 F (37.3 C)     Temp Source 03/19/22 1927 Oral     SpO2 03/19/22 1927 98 %     Weight --      Height --      Head Circumference --      Peak Flow --      Pain Score 03/19/22 1926 7     Pain Loc --      Pain Edu? --      Excl. in [place]? --    No data found.  Updated Vital Signs BP 118/71 (BP Location: Left Arm)   Pulse (!) 104   Temp 99.2 F (37.3 C) (Oral)   Resp 16   LMP 02/26/2022 (Approximate)   SpO2 98%   Visual Acuity Right Eye Distance:   Left Eye Distance:   Bilateral Distance:    Right Eye Near:   Left Eye Near:    Bilateral Near:     Physical Exam Constitutional:      Appearance:  Normal appearance.  HENT:     Head: Normocephalic.     Right Ear: Tympanic membrane, ear canal and external ear normal.     Left Ear: Tympanic membrane, ear canal and external ear normal.     Nose: Congestion present. No rhinorrhea.     Mouth/Throat:     Mouth: Mucous membranes are moist.     Pharynx: Posterior oropharyngeal erythema present.  Eyes:     Extraocular Movements: Extraocular movements intact.  Cardiovascular:     Rate and Rhythm: Normal rate and regular rhythm.     Pulses: Normal pulses.     Heart sounds: Normal heart sounds.  Pulmonary:     Effort: Pulmonary effort is normal.     Breath sounds: Normal breath sounds.  Musculoskeletal:     Cervical back: Normal range of motion.  Lymphadenopathy:     Cervical: Cervical adenopathy present.  Skin:    General: Skin is warm and dry.  Neurological:     Mental Status: She is alert and oriented to person, place, and time. Mental status is at baseline.  Psychiatric:        Mood and Affect: Mood normal.        Behavior: Behavior normal.      UC Treatments / Results  Labs (all labs ordered are listed, but only abnormal results are displayed) Labs Reviewed  RAPID INFLUENZA A&B ANTIGENS  GROUP A STREP BY PCR    EKG   Radiology No results found.  Procedures Procedures (including critical care time)  Medications Ordered in UC Medications - No data to display  Initial Impression / Assessment and Plan / UC Course  I have reviewed the triage vital signs and the nursing notes.  Pertinent labs & imaging results that were available during my care of the patient were reviewed by me and considered in my medical decision making (see chart for details).  Viral URI with cough  Strep and flu testing negative, home COVID test negative, patient is in no signs of distress nontoxic-appearing, may use over-the-counter medications for supportive care with follow-up with urgent care as needed Final Clinical Impressions(s) / UC  Diagnoses   Final diagnoses:  None   Discharge Instructions   None    ED Prescriptions   None    PDMP not reviewed this  encounter.   Valinda Hoar, [place] 03/21/22 [phone]

## 2022-03-19 NOTE — Discharge Instructions (Addendum)
Your symptoms today are most likely being caused by a virus and should steadily improve in time it can take up to 7 to 10 days before you truly start to see a turnaround however things will get better  Flu and strep negative    You can take Tylenol and/or Ibuprofen as needed for fever reduction and pain relief.   For cough: honey 1/2 to 1 teaspoon (you can dilute the honey in water or another fluid).  You can also use guaifenesin and dextromethorphan for cough. You can use a humidifier for chest congestion and cough.  If you don't have a humidifier, you can sit in the bathroom with the hot shower running.      For sore throat: try warm salt water gargles, cepacol lozenges, throat spray, warm tea or water with lemon/honey, popsicles or ice, or OTC cold relief medicine for throat discomfort.   For congestion: take a daily anti-histamine like Zyrtec, Claritin, and a oral decongestant, such as pseudoephedrine.  You can also use Flonase 1-2 sprays in each nostril daily.   It is important to stay hydrated: drink plenty of fluids (water, gatorade/powerade/pedialyte, juices, or teas) to keep your throat moisturized and help further relieve irritation/discomfort.

## 2022-03-19 NOTE — ED Triage Notes (Signed)
Chief Complaint: chills, sore throat, swollen lymph nodes, fevers. Took a negative Covid test yesterday   Onset: last night   Prescriptions or OTC medications tried: Yes- dayquil    with no relief  Sick exposure: No  New foods, medications, or products: No  Recent Travel: No

## 2022-03-23 ENCOUNTER — Ambulatory Visit
Admission: EM | Admit: 2022-03-23 | Discharge: 2022-03-23 | Disposition: A | Discharge status: Home / Self Care | Payer: Medicaid Other | Attending: Urgent Care | Admitting: Urgent Care

## 2022-03-23 DIAGNOSIS — J209 Acute bronchitis, unspecified: Secondary | ICD-10-CM

## 2022-03-23 DIAGNOSIS — J069 Acute upper respiratory infection, unspecified: Secondary | ICD-10-CM

## 2022-03-23 DIAGNOSIS — J22 Unspecified acute lower respiratory infection: Secondary | ICD-10-CM

## 2022-03-23 MED ORDER — HYDROCOD POLI-CHLORPHE POLI ER 10-8 MG/5ML PO SUER: 5.0000 mL | Freq: Two times a day (BID) | ORAL | 0 refills | Status: AC | PRN

## 2022-03-23 MED ORDER — BENZONATATE 100 MG PO CAPS: ORAL_CAPSULE | ORAL | 0 refills | Status: AC

## 2022-03-23 MED ORDER — AMOXICILLIN-POT CLAVULANATE 875-125 MG PO TABS: 1.0000 | ORAL_TABLET | Freq: Two times a day (BID) | ORAL | 0 refills | Status: AC

## 2022-03-23 NOTE — ED Provider Notes (Signed)
[facility]    CSN: [id number] Arrival date & time: 03/23/22  1100      History   Chief Complaint Chief Complaint  Patient presents with   Generalized Body Aches   Chills   Night Sweats   Cough    HPI Jennifer Fuentes is a 21 y.o. female.    Cough   Presents to urgent care with complaint of unresolved flulike symptoms.  Patient was seen on 03/19/2022 at other urgent care.  Presents with elevated temperature of 100.3 F in clinic and elevated heart rate of 103 bpm.  She reports night sweats and fevers that are difficult to control with ibuprofen or Tylenol.  She also reports worsening cough compared to her previous visit.  Past Medical History:  Diagnosis Date   Depression     Patient Active Problem List   Diagnosis Date Noted   [redacted] weeks gestation of pregnancy 08/14/2018   Iron deficiency anemia 07/20/2018   Indication for care in labor and delivery, antepartum 07/10/2018   Low back pain 06/22/2018   Tobacco use in pregnancy, antepartum, first trimester 12/18/2017   RLQ abdominal pain 06/04/2017   History of ovarian cyst 06/04/2017   Urinary tract infection without hematuria 06/04/2017   Family history of ovarian cancer 04/08/2017    Past Surgical History:  Procedure Laterality Date   TONSILLECTOMY     TYMPANOSTOMY TUBE PLACEMENT      OB History     Gravida  1   Para  1   Term  1   Preterm  0   AB  0   Living  1      SAB  0   IAB  0   Ectopic  0   Multiple  0   Live Births  1            Home Medications    Prior to Admission medications   Medication Sig Start Date End Date Taking? Authorizing Provider  metroNIDAZOLE (FLAGYL) 500 MG tablet Take 1 tablet (500 mg total) by mouth 2 (two) times daily. 02/21/22   White, Elita Boone, NP  promethazine-dextromethorphan (PROMETHAZINE-DM) 6.25-15 MG/5ML syrup Take 5 mLs by mouth 4 (four) times daily as needed for cough. 12/13/20   Tommie Sams, DO    Family History Family  History  Problem Relation Age of Onset   Breast cancer Maternal Grandmother    Cancer - Cervical Maternal Grandmother    Ovarian cancer Mother     Social History Social History   Tobacco Use   Smoking status: Former    Types: Cigarettes    Quit date: 12/05/2017    Years since quitting: 4.2   Smokeless tobacco: Never  Vaping Use   Vaping Use: Former  Substance Use Topics   Alcohol use: No   Drug use: Not Currently    Types: Marijuana     Allergies   Sulfa antibiotics   Review of Systems Review of Systems  Respiratory:  Positive for cough.      Physical Exam Triage Vital Signs ED Triage Vitals  Enc Vitals Group     BP 03/23/22 1123 107/74     Pulse Rate 03/23/22 1123 (!) 103     Resp 03/23/22 1123 16     Temp 03/23/22 1123 100.3 F (37.9 C)     Temp Source 03/23/22 1123 Oral     SpO2 03/23/22 1123 95 %     Weight --      Height --  Head Circumference --      Peak Flow --      Pain Score 03/23/22 1127 0     Pain Loc --      Pain Edu? --      Excl. in [place]? --    No data found.  Updated Vital Signs BP 107/74 (BP Location: Left Arm)   Pulse (!) 103   Temp 100.3 F (37.9 C) (Oral)   Resp 16   LMP 02/26/2022 (Approximate)   SpO2 95%   Visual Acuity Right Eye Distance:   Left Eye Distance:   Bilateral Distance:    Right Eye Near:   Left Eye Near:    Bilateral Near:     Physical Exam Vitals reviewed.  Constitutional:      Appearance: Normal appearance. She is ill-appearing.  Cardiovascular:     Rate and Rhythm: Normal rate and regular rhythm.     Pulses: Normal pulses.     Heart sounds: Normal heart sounds.  Pulmonary:     Effort: Pulmonary effort is normal.     Breath sounds: Normal breath sounds. No wheezing or rhonchi.  Skin:    General: Skin is warm and dry.  Neurological:     General: No focal deficit present.     Mental Status: She is alert and oriented to person, place, and time.  Psychiatric:        Mood and Affect: Mood  normal.        Behavior: Behavior normal.      UC Treatments / Results  Labs (all labs ordered are listed, but only abnormal results are displayed) Labs Reviewed - No data to display  EKG   Radiology No results found.  Procedures Procedures (including critical care time)  Medications Ordered in UC Medications - No data to display  Initial Impression / Assessment and Plan / UC Course  I have reviewed the triage vital signs and the nursing notes.  Pertinent labs & imaging results that were available during my care of the patient were reviewed by me and considered in my medical decision making (see chart for details).   Patient is febrile here with recent use of antipyretics. Satting well on room air with elevated HR of 103 bpm. Overall is ill appearing, though well hydrated and without respiratory distress. Pulmonary exam is unremarkable.   Given symptoms x 5 days, still consider most likely cause of her symptoms as a viral process though she was tested negative for flu/COVID/strep.  Will provide treatment today for daytime and nighttime cough.  She states she has tolerated hydrocodone in the past.  I will prescribe an antibiotic today to prevent another UC visit but asked her not to start taking it unless her symptoms do not resolve within a few more days.  Final Clinical Impressions(s) / UC Diagnoses   Final diagnoses:  None   Discharge Instructions   None    ED Prescriptions   None    PDMP not reviewed this encounter.   Rose Phi, [place] 03/23/22 1148

## 2022-03-23 NOTE — Discharge Instructions (Addendum)
Follow up here or with your primary care provider if your symptoms are worsening or not improving with treatment.     

## 2022-03-23 NOTE — ED Triage Notes (Signed)
Pt. was seen on 12/6 for flu-like symptoms. Pt. States her symptoms  have not resolved after following discharge instructions. Pt. States she was tested for COVID and the FLU and results were negative.

## 2022-06-03 ENCOUNTER — Encounter: Payer: Self-pay | Admitting: Hematology and Oncology

## 2023-04-15 ENCOUNTER — Encounter: Payer: Self-pay | Admitting: Hematology and Oncology

## 2023-12-02 ENCOUNTER — Encounter: Payer: Self-pay | Admitting: Emergency Medicine

## 2023-12-02 ENCOUNTER — Ambulatory Visit
Admission: EM | Admit: 2023-12-02 | Discharge: 2023-12-02 | Disposition: A | Discharge status: Home / Self Care | Attending: Emergency Medicine | Admitting: Emergency Medicine

## 2023-12-02 DIAGNOSIS — K644 Residual hemorrhoidal skin tags: Secondary | ICD-10-CM

## 2023-12-02 MED ORDER — HYDROCORTISONE (PERIANAL) 2.5 % EX CREA: 1.0000 | TOPICAL_CREAM | Freq: Two times a day (BID) | CUTANEOUS | 0 refills | Status: AC

## 2023-12-02 NOTE — Discharge Instructions (Addendum)
 Today you were evaluated for your rectal bleeding, on exam able to see a hemorrhoid which is an overgrowth of rectal tissue causing pain and itching and bleeding, definitive treatment is surgical removal if worsen, more information in your packet  Apply hydrocortisone  cream twice daily until area is healed  Ensure that you are drinking lots of water  and adequate fiber to prevent constipation as this will cause further irritation  If area becomes painful may apply ice or heat over the affected area as well as use pillows for cushions and support  Please keep upcoming follow-up appointment with your primary doctor for recheck of your symptoms  At any point if your rectal bleeding significantly worsens please go to the nearest emergency department for immediate evaluation

## 2023-12-02 NOTE — ED Provider Notes (Addendum)
 Jennifer Fuentes    CSN: [id number] Arrival date & time: 12/02/23  1846      History   Chief Complaint Chief Complaint  Patient presents with   Rectal Bleeding    HPI Jennifer Fuentes is a 23 y.o. female.   Patient presents for evaluation of rectal bleeding with bright red blood beginning 4 days ago.  Has occurred at least 3 times today, occurring independent of defecation.  Has experienced some lower abdominal pain described as a cramping occurring intermittently.  Denies rectal pain or itching.  Tolerable to food and liquids.    Past Medical History:  Diagnosis Date   Depression     Patient Active Problem List   Diagnosis Date Noted   [redacted] weeks gestation of pregnancy 08/14/2018   Iron  deficiency anemia 07/20/2018   Indication for care in labor and delivery, antepartum 07/10/2018   Low back pain 06/22/2018   Tobacco use in pregnancy, antepartum, first trimester 12/18/2017   RLQ abdominal pain 06/04/2017   History of ovarian cyst 06/04/2017   Urinary tract infection without hematuria 06/04/2017   Family history of ovarian cancer 04/08/2017    Past Surgical History:  Procedure Laterality Date   TONSILLECTOMY     TYMPANOSTOMY TUBE PLACEMENT      OB History     Gravida  1   Para  1   Term  1   Preterm  0   AB  0   Living  1      SAB  0   IAB  0   Ectopic  0   Multiple  0   Live Births  1            Home Medications    Prior to Admission medications   Medication Sig Start Date End Date Taking? Authorizing Provider  hydrocortisone  (ANUSOL -HC) 2.5 % rectal cream Place 1 Application rectally 2 (two) times daily. 12/02/23  Yes Tarea Skillman, Shelba SAUNDERS, NP  amoxicillin -clavulanate (AUGMENTIN ) 875-125 MG tablet Take 1 tablet by mouth every 12 (twelve) hours. 03/23/22   Immordino, Garnette, FNP  benzonatate  (TESSALON ) 100 MG capsule Take 1-2 tablets 3 times a day as needed for cough 03/23/22   Immordino, Garnette, FNP    Family  History Family History  Problem Relation Age of Onset   Breast cancer Maternal Grandmother    Cancer - Cervical Maternal Grandmother    Ovarian cancer Mother     Social History Social History   Tobacco Use   Smoking status: Former    Current packs/day: 0.00    Types: Cigarettes    Quit date: 12/05/2017    Years since quitting: 5.9   Smokeless tobacco: Never  Vaping Use   Vaping status: Former  Substance Use Topics   Alcohol use: No   Drug use: Not Currently    Types: Marijuana     Allergies   Sulfa  antibiotics   Review of Systems Review of Systems   Physical Exam Triage Vital Signs ED Triage Vitals  Encounter Vitals Group     BP 12/02/23 1907 113/63     Girls Systolic BP Percentile --      Girls Diastolic BP Percentile --      Boys Systolic BP Percentile --      Boys Diastolic BP Percentile --      Pulse Rate 12/02/23 1907 85     Resp 12/02/23 1907 16     Temp 12/02/23 1907 98.5 F (36.9 C)  Temp Source 12/02/23 1907 Oral     SpO2 12/02/23 1907 99 %     Weight --      Height --      Head Circumference --      Peak Flow --      Pain Score 12/02/23 1927 0     Pain Loc --      Pain Education --      Exclude from Growth Chart --    No data found.  Updated Vital Signs BP 113/63 (BP Location: Left Arm)   Pulse 85   Temp 98.5 F (36.9 C) (Oral)   Resp 16   LMP 11/16/2023 (Exact Date)   SpO2 99%   Visual Acuity Right Eye Distance:   Left Eye Distance:   Bilateral Distance:    Right Eye Near:   Left Eye Near:    Bilateral Near:     Physical Exam Constitutional:      Appearance: Normal appearance.  Eyes:     Extraocular Movements: Extraocular movements intact.  Pulmonary:     Effort: Pulmonary effort is normal.  Genitourinary:     Comments: External hemorrhoid present on exam, bleeding, nontender, no anal tear noted Neurological:     Mental Status: She is alert and oriented to person, place, and time. Mental status is at baseline.       UC Treatments / Results  Labs (all labs ordered are listed, but only abnormal results are displayed) Labs Reviewed - No data to display  EKG   Radiology No results found.  Procedures Procedures (including critical care time)  Medications Ordered in UC Medications - No data to display  Initial Impression / Assessment and Plan / UC Course  I have reviewed the triage vital signs and the nursing notes.  Pertinent labs & imaging results that were available during my care of the patient were reviewed by me and considered in my medical decision making (see chart for details).  External hemorrhoid  Atypical presentation as she has associated abdominal cramping, no abdominal tenderness noted on exam, not guarding stable ,advised to monitor closely, external hemorrhoid noted we will move forward with treatment as cause of rectal bleeding, prescribed Anusol  cream and recommended additional nonpharmacological supportive care, has PCP appointment in 1 week for reevaluation, given strict ER precautions at any point if symptoms worsen she is to seek reevaluation, verbalized understanding Final Clinical Impressions(s) / UC Diagnoses   Final diagnoses:  External hemorrhoid     Discharge Instructions      Today you were evaluated for your rectal bleeding, on exam able to see a hemorrhoid which is an overgrowth of rectal tissue causing pain and itching and bleeding, definitive treatment is surgical removal if worsen, more information in your packet  Apply hydrocortisone  cream twice daily until area is healed  Ensure that you are drinking lots of water  and adequate fiber to prevent constipation as this will cause further irritation  If area becomes painful may apply ice or heat over the affected area as well as use pillows for cushions and support  Please keep upcoming follow-up appointment with your primary doctor for recheck of your symptoms  At any point if your rectal bleeding  significantly worsens please go to the nearest emergency department for immediate evaluation   ED Prescriptions     Medication Sig Dispense Auth. Provider   hydrocortisone  (ANUSOL -HC) 2.5 % rectal cream Place 1 Application rectally 2 (two) times daily. 30 g Teresa Shelba SAUNDERS, NP  PDMP not reviewed this encounter.   Teresa Shelba SAUNDERS, NP 12/02/23 1937    Teresa Shelba SAUNDERS, NP 12/02/23 [phone]

## 2023-12-02 NOTE — ED Triage Notes (Signed)
 Patient complains of rectal bleeding x 4 days.
